# Patient Record
Sex: Male | Born: 1970 | Hispanic: No | Marital: Married
Health system: Southern US, Community
[De-identification: ages and names within clinical notes are randomized; demographics above are authoritative.]

---

## 2010-04-03 ENCOUNTER — Ambulatory Visit: Payer: Self-pay | Admitting: Hematology and Oncology

## 2010-04-04 ENCOUNTER — Other Ambulatory Visit: Payer: Self-pay | Admitting: Hematology and Oncology

## 2010-04-04 DIAGNOSIS — R161 Splenomegaly, not elsewhere classified: Secondary | ICD-10-CM

## 2010-04-04 LAB — CBC WITH DIFFERENTIAL/PLATELET
BASO%: 0.3 % (ref 0.0–2.0)
Basophils Absolute: 0 10*3/uL (ref 0.0–0.1)
EOS%: 1.8 % (ref 0.0–7.0)
Eosinophils Absolute: 0.1 10*3/uL (ref 0.0–0.5)
HCT: 41.9 % (ref 38.4–49.9)
HGB: 14.3 g/dL (ref 13.0–17.1)
LYMPH%: 30.3 % (ref 14.0–49.0)
MCH: 30.1 pg (ref 27.2–33.4)
MCHC: 34.2 g/dL (ref 32.0–36.0)
MCV: 88.2 fL (ref 79.3–98.0)
MONO#: 0.2 10*3/uL (ref 0.1–0.9)
MONO%: 7.4 % (ref 0.0–14.0)
NEUT#: 2 10*3/uL (ref 1.5–6.5)
NEUT%: 60.2 % (ref 39.0–75.0)
Platelets: 186 10*3/uL (ref 140–400)
RBC: 4.75 10*6/uL (ref 4.20–5.82)
RDW: 13.3 % (ref 11.0–14.6)
WBC: 3.3 10*3/uL — ABNORMAL LOW (ref 4.0–10.3)
lymph#: 1 10*3/uL (ref 0.9–3.3)

## 2010-04-04 LAB — MORPHOLOGY
PLT EST: ADEQUATE
RBC Comments: NORMAL

## 2010-04-05 LAB — COMPREHENSIVE METABOLIC PANEL
ALT: 15 U/L (ref 0–53)
AST: 15 U/L (ref 0–37)
Albumin: 4.5 g/dL (ref 3.5–5.2)
Alkaline Phosphatase: 62 U/L (ref 39–117)
BUN: 16 mg/dL (ref 6–23)
CO2: 26 mEq/L (ref 19–32)
Calcium: 9.7 mg/dL (ref 8.4–10.5)
Chloride: 103 mEq/L (ref 96–112)
Creatinine, Ser: 1.34 mg/dL (ref 0.40–1.50)
Glucose, Bld: 102 mg/dL — ABNORMAL HIGH (ref 70–99)
Potassium: 4.3 mEq/L (ref 3.5–5.3)
Sodium: 138 mEq/L (ref 135–145)
Total Bilirubin: 0.9 mg/dL (ref 0.3–1.2)
Total Protein: 7.4 g/dL (ref 6.0–8.3)

## 2010-04-05 LAB — LACTATE DEHYDROGENASE: LDH: 127 U/L (ref 94–250)

## 2010-04-05 LAB — VITAMIN B12: Vitamin B-12: 264 pg/mL (ref 211–911)

## 2010-04-05 LAB — FOLATE: Folate: 16.2 ng/mL

## 2010-04-05 LAB — TSH: TSH: 1.109 u[IU]/mL (ref 0.350–4.500)

## 2010-04-05 LAB — ANCA SCREEN W REFLEX TITER
Atypical p-ANCA Screen: NEGATIVE
c-ANCA Screen: NEGATIVE
p-ANCA Screen: NEGATIVE

## 2010-04-05 LAB — ANA: Anti Nuclear Antibody(ANA): NEGATIVE

## 2010-04-22 ENCOUNTER — Ambulatory Visit (HOSPITAL_COMMUNITY)
Admission: RE | Admit: 2010-04-22 | Discharge: 2010-04-22 | Disposition: A | Discharge status: Home / Self Care | Payer: BC Managed Care – PPO | Source: Ambulatory Visit | Attending: Hematology and Oncology | Admitting: Hematology and Oncology

## 2010-04-22 ENCOUNTER — Other Ambulatory Visit: Payer: Self-pay | Admitting: Hematology and Oncology

## 2010-04-22 DIAGNOSIS — R161 Splenomegaly, not elsewhere classified: Secondary | ICD-10-CM

## 2010-04-22 DIAGNOSIS — D72819 Decreased white blood cell count, unspecified: Secondary | ICD-10-CM | POA: Insufficient documentation

## 2010-04-23 ENCOUNTER — Encounter (HOSPITAL_BASED_OUTPATIENT_CLINIC_OR_DEPARTMENT_OTHER): Payer: BC Managed Care – PPO | Admitting: Hematology and Oncology

## 2010-04-23 DIAGNOSIS — D709 Neutropenia, unspecified: Secondary | ICD-10-CM

## 2011-01-10 ENCOUNTER — Encounter: Payer: Self-pay | Admitting: Nurse Practitioner

## 2011-01-16 ENCOUNTER — Telehealth: Payer: Self-pay | Admitting: Hematology and Oncology

## 2011-01-16 NOTE — Telephone Encounter (Signed)
Due to epic conversion nov appt moved to [place]. Also per 10/29 pof pt FTKA for 10/30 lb - r/s 1wk b4 jan f/u lmonvm for pt today re changes w/new appt d/t's for 1/4 + 1/11. Jan schedule mailed today.

## 2011-03-21 ENCOUNTER — Other Ambulatory Visit: Payer: Self-pay | Admitting: Hematology and Oncology

## 2011-03-21 ENCOUNTER — Other Ambulatory Visit (HOSPITAL_BASED_OUTPATIENT_CLINIC_OR_DEPARTMENT_OTHER): Payer: BC Managed Care – PPO

## 2011-03-21 DIAGNOSIS — D709 Neutropenia, unspecified: Secondary | ICD-10-CM

## 2011-03-21 LAB — BASIC METABOLIC PANEL
BUN: 13 mg/dL (ref 6–23)
CO2: 27 mEq/L (ref 19–32)
Chloride: 104 mEq/L (ref 96–112)
Creatinine, Ser: 1.4 mg/dL — ABNORMAL HIGH (ref 0.50–1.35)
Glucose, Bld: 79 mg/dL (ref 70–99)

## 2011-03-21 LAB — CBC WITH DIFFERENTIAL/PLATELET
BASO%: 0.3 % (ref 0.0–2.0)
Basophils Absolute: 0 10*3/uL (ref 0.0–0.1)
Eosinophils Absolute: 0.1 10*3/uL (ref 0.0–0.5)
HCT: 40.7 % (ref 38.4–49.9)
HGB: 13.8 g/dL (ref 13.0–17.1)
LYMPH%: 34.4 % (ref 14.0–49.0)
MONO#: 0.3 10*3/uL (ref 0.1–0.9)
NEUT#: 1.9 10*3/uL (ref 1.5–6.5)
NEUT%: 54.4 % (ref 39.0–75.0)
Platelets: 206 10*3/uL (ref 140–400)
WBC: 3.5 10*3/uL — ABNORMAL LOW (ref 4.0–10.3)
lymph#: 1.2 10*3/uL (ref 0.9–3.3)

## 2011-03-24 ENCOUNTER — Telehealth: Payer: Self-pay | Admitting: Hematology and Oncology

## 2011-03-24 NOTE — Telephone Encounter (Signed)
S/w pt today re appt for 1/8 @ 11:15 am. Per thu 1/11 appt r/s to 1/8 w/RJ.

## 2011-03-25 ENCOUNTER — Telehealth: Payer: Self-pay | Admitting: Hematology and Oncology

## 2011-03-25 ENCOUNTER — Ambulatory Visit (HOSPITAL_BASED_OUTPATIENT_CLINIC_OR_DEPARTMENT_OTHER): Payer: BC Managed Care – PPO | Admitting: Physician Assistant

## 2011-03-25 VITALS — BP 126/74 | HR 75 | Temp 97.8°F | Ht 70.0 in | Wt 191.9 lb

## 2011-03-25 DIAGNOSIS — D709 Neutropenia, unspecified: Secondary | ICD-10-CM

## 2011-03-25 NOTE — Telephone Encounter (Signed)
gve the pt his oct 2013 appt calendar °

## 2011-03-25 NOTE — Progress Notes (Signed)
This office note has been dictated.

## 2011-03-25 NOTE — Progress Notes (Signed)
CC:   Cain Saupe, MD  IDENTIFYING STATEMENT:  Mr. 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  INTERIM HISTORY:  Mr. Mckinney reports since his last clinic visit in February 2012 he has had overall normal energy level.  No fevers, chills or night sweats.  No problems with dyspnea or cough.  He does report a recent cold but states that he has completely gotten over this.  He has had no issues with anorexia, no nausea or vomiting, constipation or diarrhea.  No rectal bleeding.  No dysuria, no frequency or hematuria. No alteration in sensation or balance or swelling of extremities.  He does report some intermittent right hip discomfort which has been present since the fall of the year.  He was evaluated by his primary physician on this past Monday and states that at that time he did not have any type of x-rays and also it was felt that he would continue to monitor the discomfort and he would follow back up with his primary if the pain worsened.  He states it does not affect his ability to ambulate and this does not occur every daily.  Usually it is relieved with ibuprofen 1 dose.  Other medications are reviewed and recorded, and the patient states he does still take a multivitamin with B12 in it as well as lisinopril with hydrochlorothiazide.  PHYSICAL EXAMINATION:  Temperature is 97.8, heart rate 75, respirations 20, blood pressure 126/74, weight is 191 pounds 14 ounces.  General: This is a well-developed, well-nourished black male in no acute distress.  HEENT:  Sclerae are nonicteric.  There is no oral thrush or mucositis.  Skin:  No rashes or lesions.  Lymphatic:  No cervical, supraclavicular, axillary or inguinal lymphadenopathy.  Cardiac: Regular rate and rhythm without murmurs or gallops.  Peripheral pulses are 2-plus.  Chest:  Lungs clear to auscultation.  Abdomen:  Positive bowel sounds, soft, nontender, nondistended.  No  organomegaly. Extremities:  No edema, cyanosis or calf tenderness.  Neurologic:  Alert and oriented x3.  Strength, sensation and coordination all grossly intact.  LABORATORY DATA:  Laboratory data from April 10, 2011:  CBC with differential reveals white blood count of 3.5, hemoglobin 13.8, hematocrit of 40.7, platelets of 206, ANC of 1.9, and MCV of 85.7. Chemistries reveal a sodium of 141, potassium 4.5, chloride 104, BUN 13, creatinine 1.40, glucose of 79, and calcium of 9.6.  B12 level of 364.  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  He has had stable white blood cell count over the past 11 months and no sense of ill health.  He will be scheduled for followup with Dr. Dalene Carrow in 9 months' time.  A few days before this we will reassess CBC with differential, BMET and B12 level.  He is advised to call in the interim with any questions or problems.    ______________________________ Michail Sermon, NP RH/MEDQ  D:  03/25/2011  T:  03/25/2011  Job:  [id number]

## 2011-03-28 ENCOUNTER — Ambulatory Visit: Payer: BC Managed Care – PPO | Admitting: Physician Assistant

## 2011-12-23 ENCOUNTER — Telehealth: Payer: Self-pay | Admitting: Hematology and Oncology

## 2011-12-23 NOTE — Telephone Encounter (Signed)
°  I spoke with Mr. Uhrig today regarding a new lab time because the lab staff has an AM meeting on 12/25/11. Mr. Dudek states he will not be able to keep his 10/10 lab appt or the 10/15 f/u appt because he will be out of town for a few more weeks. He says he will call back to let us know when he can come in. Message to LO.   Melissa

## 2011-12-25 ENCOUNTER — Other Ambulatory Visit: Payer: BC Managed Care – PPO | Admitting: Lab

## 2011-12-29 ENCOUNTER — Telehealth: Payer: Self-pay | Admitting: Hematology and Oncology

## 2011-12-29 NOTE — Telephone Encounter (Signed)
Pt called and wants to r/s 10/15 lb to late December or early January. Pt also missed 10/10 lb. Message to LO. Pt aware.

## 2011-12-30 ENCOUNTER — Telehealth: Payer: Self-pay | Admitting: *Deleted

## 2011-12-30 ENCOUNTER — Ambulatory Visit: Payer: BC Managed Care – PPO | Admitting: Hematology and Oncology

## 2011-12-30 ENCOUNTER — Other Ambulatory Visit: Payer: Self-pay | Admitting: *Deleted

## 2011-12-30 NOTE — Telephone Encounter (Signed)
03-19-2012 at 9:30am  03-16-2012 at 9:00am  Mailed out calendar to inform the patient of the new date and time

## 2011-12-31 ENCOUNTER — Other Ambulatory Visit: Payer: Self-pay | Admitting: *Deleted

## 2012-02-02 IMAGING — US US ABDOMEN LIMITED
1 series · 8 of 8 positions shown · non-contrast
Comparison: None

CLINICAL DATA: Decreased white blood cell count.  Evaluate splenic
sinus

ABDOMEN ULTRASOUND LIMITED
TECHNIQUE: Routine study limited to the spleen.

[Series 1: us abdomen limited · 0.30mm/px · 8 of 8 slices shown]
[im 1/8]
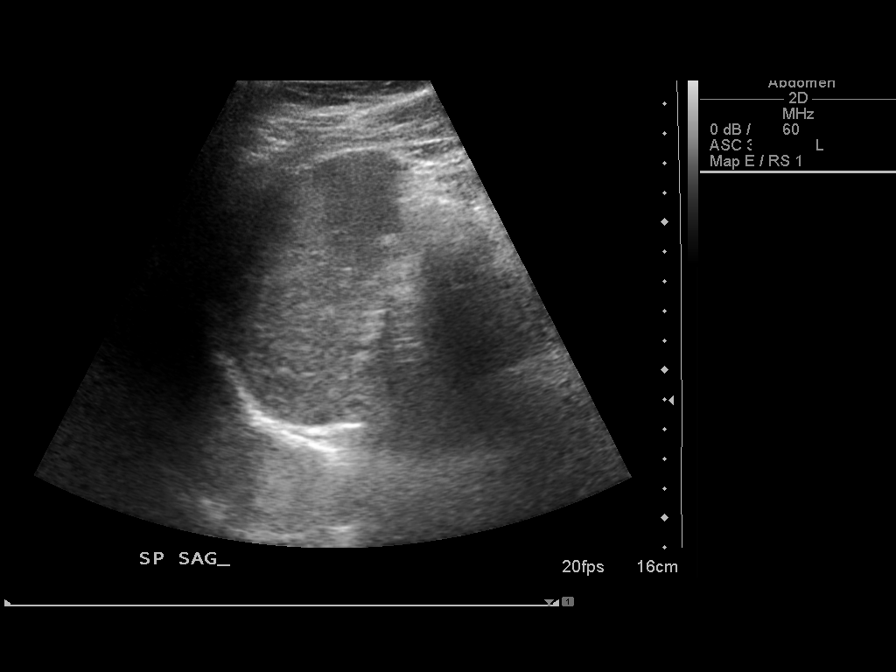
[im 2/8]
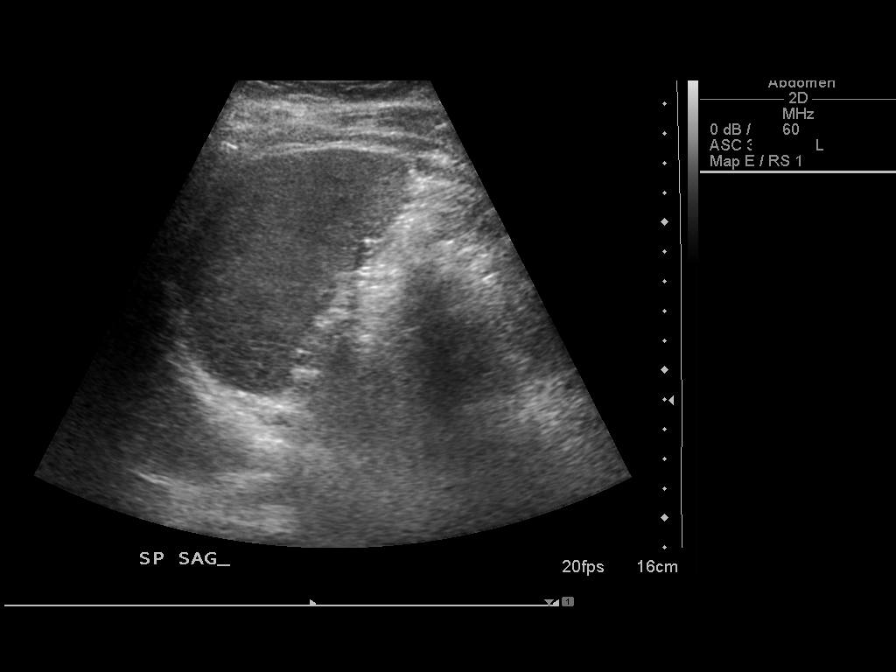
[im 3/8]
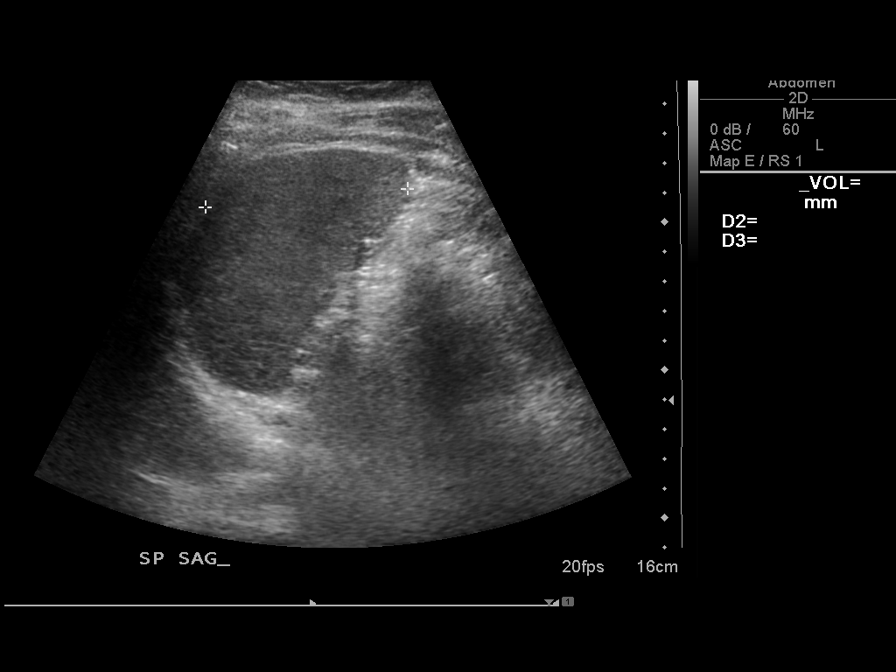
[im 4/8]
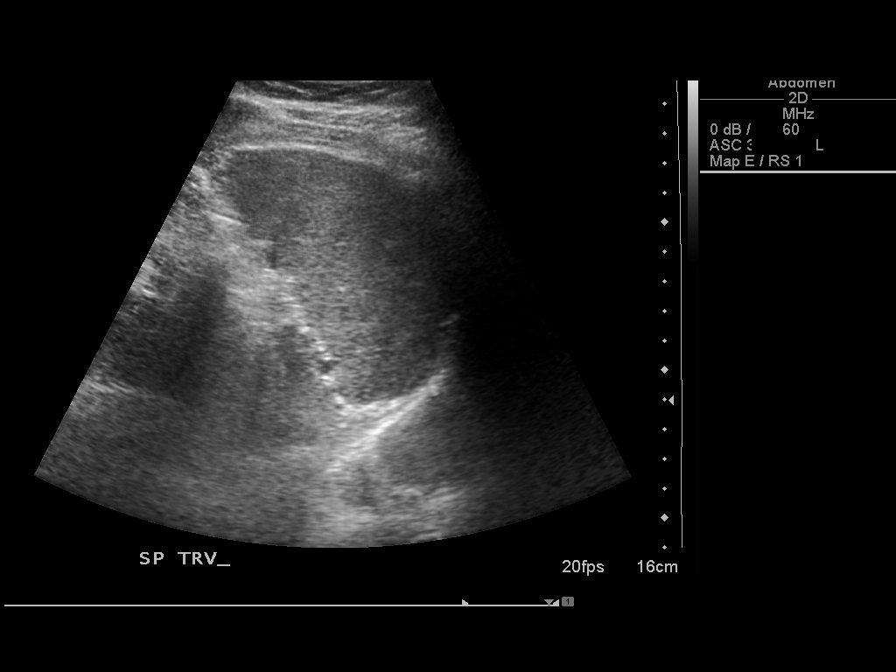
[im 5/8]
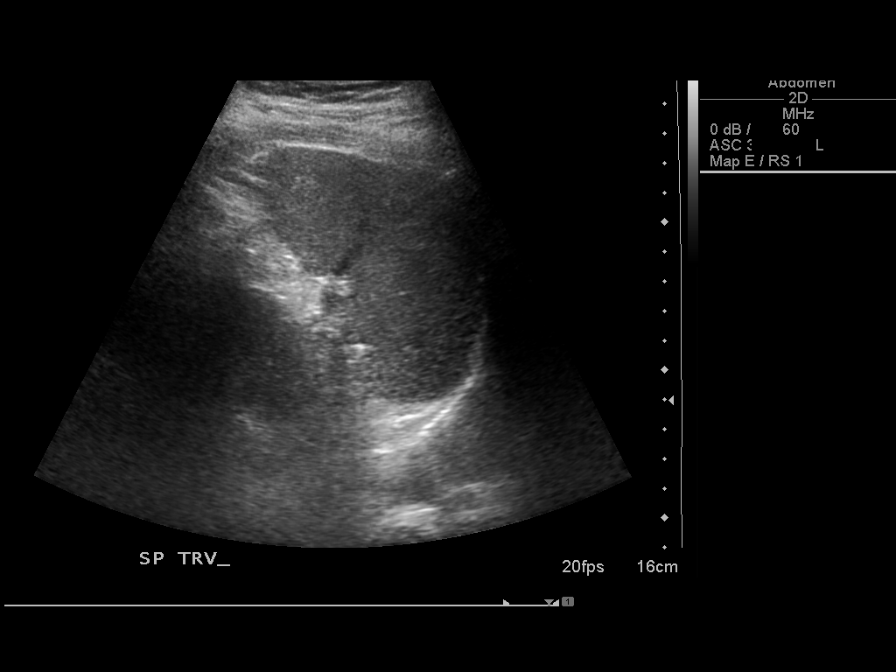
[im 6/8]
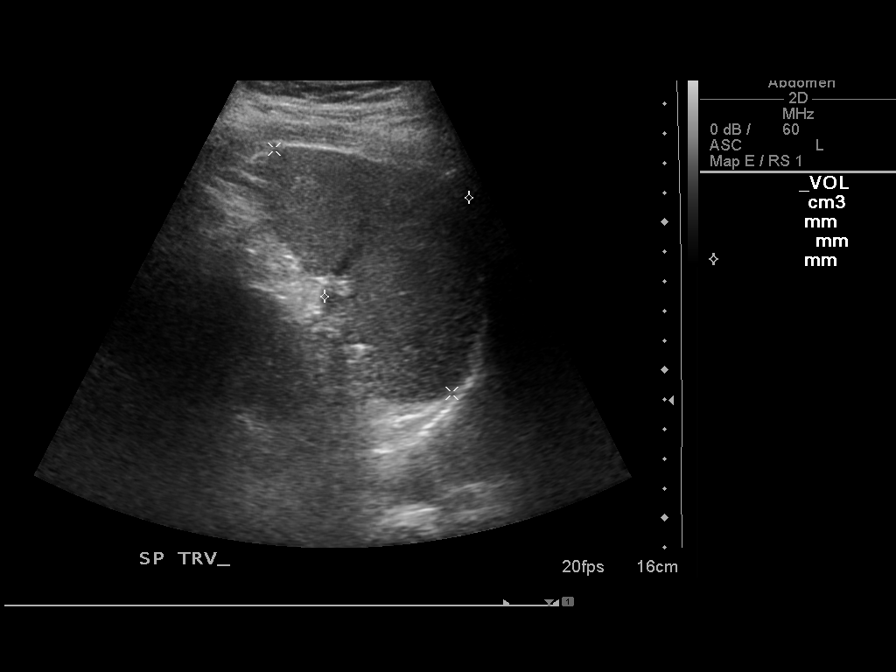
[im 7/8]
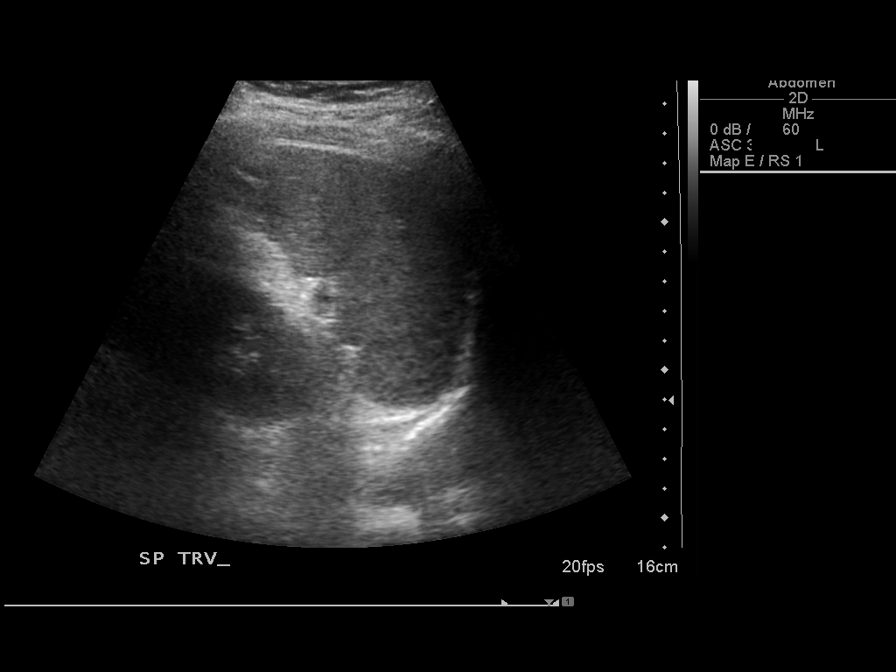
[im 8/8]
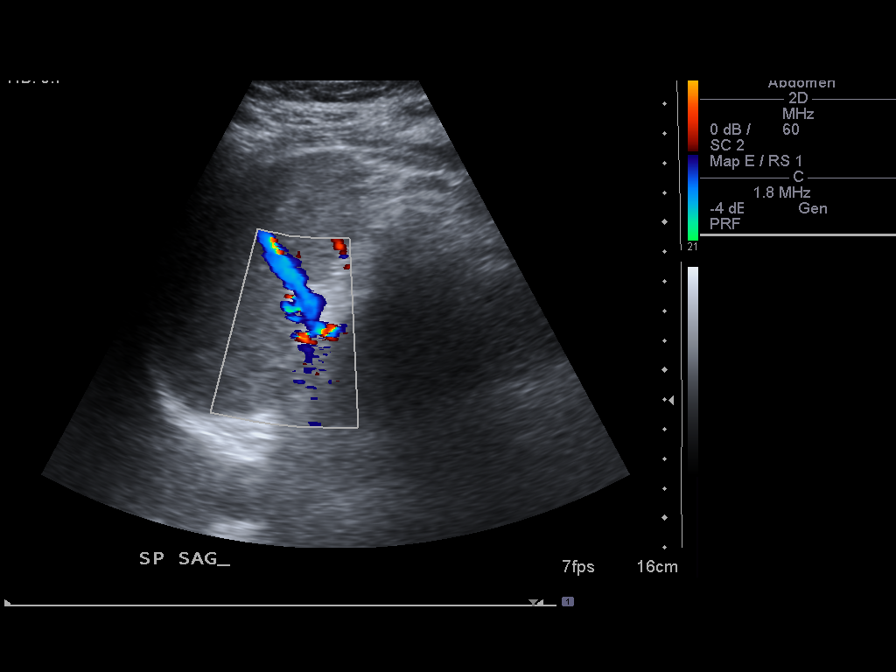

[8 of 8 positions shown; findings below may reference images not displayed]

FINDINGS: The spleen measures 6.9 x 10.2 x 5.9 cm.  Volume is
estimated at 217 cm cubed. There are no focal lesions.
IMPRESSION: The spleen is sonographically normal.

## 2012-03-06 ENCOUNTER — Telehealth: Payer: Self-pay | Admitting: Oncology

## 2012-03-06 NOTE — Telephone Encounter (Signed)
lmonvm adviisng the pt to gve me a call back regarding rescheduling his appt.

## 2012-03-16 ENCOUNTER — Other Ambulatory Visit: Payer: BC Managed Care – PPO | Admitting: Lab

## 2012-03-19 ENCOUNTER — Ambulatory Visit: Payer: BC Managed Care – PPO | Admitting: Hematology and Oncology

## 2012-03-20 ENCOUNTER — Telehealth: Payer: Self-pay | Admitting: Oncology

## 2012-03-20 ENCOUNTER — Encounter: Payer: Self-pay | Admitting: Oncology

## 2012-03-20 NOTE — Telephone Encounter (Signed)
S/w pt today re reassignment and new provider. Per pt appt moved from 1/10 to 1/31.

## 2012-03-23 ENCOUNTER — Ambulatory Visit: Payer: BC Managed Care – PPO | Admitting: Oncology

## 2012-03-26 ENCOUNTER — Ambulatory Visit: Payer: BC Managed Care – PPO | Admitting: Nurse Practitioner

## 2012-04-16 ENCOUNTER — Ambulatory Visit: Payer: BC Managed Care – PPO | Admitting: Nurse Practitioner

## 2013-05-14 ENCOUNTER — Encounter: Payer: Self-pay | Admitting: *Deleted

## 2013-05-14 ENCOUNTER — Telehealth: Payer: Self-pay | Admitting: *Deleted

## 2013-05-14 NOTE — Telephone Encounter (Signed)
Former pt of DR. G...td  

## 2013-07-20 ENCOUNTER — Ambulatory Visit: Payer: BC Managed Care – PPO | Admitting: Oncology

## 2014-03-20 ENCOUNTER — Emergency Department (HOSPITAL_COMMUNITY)
Admission: AD | Admit: 2014-03-20 | Discharge: 2014-04-17 | Disposition: E | Payer: BC Managed Care – PPO | Source: Other Acute Inpatient Hospital | Attending: Emergency Medicine | Admitting: Emergency Medicine

## 2014-03-20 ENCOUNTER — Encounter (HOSPITAL_COMMUNITY): Payer: Self-pay | Admitting: Emergency Medicine

## 2014-03-20 ENCOUNTER — Encounter (HOSPITAL_COMMUNITY): Admission: AD | Disposition: E | Payer: Self-pay | Attending: Emergency Medicine

## 2014-03-20 DIAGNOSIS — Z862 Personal history of diseases of the blood and blood-forming organs and certain disorders involving the immune mechanism: Secondary | ICD-10-CM | POA: Insufficient documentation

## 2014-03-20 DIAGNOSIS — I469 Cardiac arrest, cause unspecified: Secondary | ICD-10-CM | POA: Diagnosis present

## 2014-03-20 DIAGNOSIS — Z79899 Other long term (current) drug therapy: Secondary | ICD-10-CM | POA: Insufficient documentation

## 2014-03-20 DIAGNOSIS — Z978 Presence of other specified devices: Secondary | ICD-10-CM

## 2014-03-20 DIAGNOSIS — R4189 Other symptoms and signs involving cognitive functions and awareness: Secondary | ICD-10-CM

## 2014-03-20 DIAGNOSIS — R079 Chest pain, unspecified: Secondary | ICD-10-CM | POA: Insufficient documentation

## 2014-03-20 SURGERY — LEFT HEART CATHETERIZATION WITH CORONARY ANGIOGRAM
Anesthesia: LOCAL

## 2014-03-20 MED ORDER — DEXTROSE 5 % IV SOLN
300.0000 mg | INTRAVENOUS | Status: AC | PRN
  Administered 2014-03-20: 300 mg via INTRAVENOUS

## 2014-03-20 MED ORDER — ATROPINE SULFATE 1 MG/ML IJ SOLN
INTRAMUSCULAR | Status: AC | PRN
  Administered 2014-03-20: 1 mg via INTRAVENOUS

## 2014-03-20 MED ORDER — EPINEPHRINE HCL 0.1 MG/ML IJ SOSY
PREFILLED_SYRINGE | INTRAMUSCULAR | Status: AC | PRN
  Administered 2014-03-20: 1 mg via INTRAVENOUS
  Administered 2014-03-20: 1 via INTRAVENOUS
  Administered 2014-03-20 (×3): 1 mg via INTRAVENOUS

## 2014-04-17 DIAGNOSIS — 419620001 Death: Secondary | SNOMED CT

## 2014-04-17 NOTE — Progress Notes (Addendum)
Chaplain paged for the arrival of more of Mr. Brian Avila family. His Godmother is currently bedside.   Wife asked earlier that when his parents arrived, Brian Avila and Brian Avila, that his wife be notified so that she can be moved to a family room so that she doesn't have to see them. The situation would be very hostile if they should meet.   Pt has a son from a previous relationship, wife does not wish to be present if the mother of his child should arrive also.   Please page me and call the nurse's station so that his wife can be moved to another room. Family asked that if available I be present.   Gala Romney, Chaplain 2014-03-30

## 2014-04-17 NOTE — ED Notes (Signed)
[facility]. Call for syncope. EMS arrived Pt unresponsive. NSR initially, changed to torsades minutes after arrival. Defib at 200 by EMS. Intubated with 7.0 ETT by EMS in field. Asystole upon EMS arrival to [facility], compressions started.

## 2014-04-17 NOTE — ED Provider Notes (Addendum)
Seen on arrival, CPR in progress per patient's wife patient had been complaining of chest pain for several hours prior to calling 911. He collapsed in the car. EMS arrived to find him with agonal respirations unresponsive sinus rhythm with a weak carotid pulse. Patient's rhythm deteriorated to ventricular tachycardia without pulses. He was defibrillated in the field. Rhythm return to sinus bradycardia with weak carotid pulses per EMS. prehospital 12-lead EKG showed acute anterior wall myocardial infarction. Upon arrival here CPR in progress. He had reportedly lost his pulses again as the ambulance was pulling into the driveway of the hospital. Code STEMI called by me prior to patient's arrival as well as code"Ice". Rhythm ventricular fibrillation. He was defibrillated with 300 J. CPR continued. ET tube placement confirmed by me using direct laryngoscopy ET tube in trachea. Administered amiodarone 300 mg IV and epinephrine intravenously. Patient pronounced dead at 1816 p.m. Wife was here in the emergency department and notified. Medical examiner notified and will assume care of body. Diagnosis dead on arrival CRITICAL CARE Performed by: Doug Sou Total critical care time: 30 minute Critical care time was exclusive of separately billable procedures and treating other patients. Critical care was necessary to treat or prevent imminent or life-threatening deterioration. Critical care was time spent personally by me on the following activities: development of treatment plan with patient and/or surrogate as well as nursing, discussions with consultants, evaluation of patient's response to treatment, examination of patient, obtaining history from patient or surrogate, ordering and performing treatments and interventions, ordering and review of laboratory studies, ordering and review of radiographic studies, pulse oximetry and re-evaluation of patient's condition.  Doug Sou, MD 29-Mar-2014 1836  Doug Sou, MD 2014-03-29 Brian Avila

## 2014-04-17 NOTE — ED Notes (Signed)
Chaplain and last of family are leaving now. Patient being placed in post mortem bag, eyes dampened with saline gauze, and bed control aware of patient's impending arrival to the morgue.

## 2014-04-17 NOTE — Progress Notes (Signed)
Chaplain with pt's wife since arrival providing emotional support. Pt passed and wife is currently grieving.  Wife expressed she is not from here and has no family in the area. Pt is a member in the [employer]. Wife contacted his captain before he passed.   There is tension between the pt's wife and mother. Wife is asking that she not see his mother. Front desk has been asked to call the nurse's station before Brian Avila mother comes to the room so that pt's wife can be moved elsewhere.   Family friends are present with pt's wife at bedside.   Page as needed.

## 2014-04-17 NOTE — Progress Notes (Signed)
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 Some bedside with parents, others in consult A with his wife and other friends.

## 2014-04-17 NOTE — Progress Notes (Signed)
Family requested that if possible and lawful that Medical Examiner inform them of what's going on because " his wife is not speaking to [place]; we respect her rights as his wife and would also like to be informed about our son".   I told the family I would make a note in pt's chart concerning the ordeal.   Pt's Parents are Mr. And Mrs. John & Morrison Mcbryar.   [phone] Cell  [phone] Home

## 2014-04-17 NOTE — ED Provider Notes (Signed)
CSN: [id number]     Arrival date & time March 26, 2014  1751 History   First MD Initiated Contact with Patient 03/26/2014 1828     Chief Complaint  Patient presents with  . Cardiac Arrest     (Consider location/radiation/quality/duration/timing/severity/associated sxs/prior Treatment) Patient is a 44 y.o. male presenting with general illness.  Illness Location:  Cardiac arrest Quality:  Arrest Severity:  Severe Onset quality:  Sudden Duration: patient had complained of chest pain for several hours per wife, on EMS arrival he had agonal respirations, weak carotid pulse. Timing:  Constant Progression:  Waxing and waning Chronicity:  New Context:  Pt with chest paiin for hours, syncopized and EMS called and found him agonal with weak pulse, he then had vtach and was defibrillated, with return to sinus bradycardia and 12 lead showed acute anterior MI, He was intubated and transported however lost pulse again on arrival to the ED Relieved by:  Epi/defibrillation Ineffective treatments:  Epi/defibrillation/pacing Associated symptoms: chest pain     Past Medical History  Diagnosis Date  . Neutropenia    History reviewed. No pertinent past surgical history. History reviewed. No pertinent family history. History  Substance Use Topics  . Smoking status: Not on file  . Smokeless tobacco: Not on file  . Alcohol Use: Not on file    Review of Systems  Unable to perform ROS: Intubated  Cardiovascular: Positive for chest pain.      Allergies  Review of patient's allergies indicates no known allergies.  Home Medications   Prior to Admission medications   Medication Sig Start Date End Date Taking? Authorizing Provider  lisinopril-hydrochlorothiazide (PRINZIDE,ZESTORETIC) 20-25 MG per tablet Take 1 tablet by mouth daily.      Historical Provider, MD  multivitamin-iron-minerals-folic acid (CENTRUM) chewable tablet Chew 1 tablet by mouth daily.      Historical Provider, MD   Pulse   Wt 190  lb (86.183 kg) Physical Exam  Constitutional: He appears well-developed and well-nourished. He has a sickly appearance. He appears ill.  Eyes: Pupils are equal, round, and reactive to light.  Cardiovascular:  Femoral pulses with compressions, no pulses on pulse check   Pulmonary/Chest: He has no wheezes. He has no rales.  Intubated, bilateral breath sounds   Abdominal: He exhibits no distension. There is no tenderness.  Musculoskeletal: He exhibits no edema.  Neurological: He is unresponsive. GCS eye subscore is 1. GCS verbal subscore is 1. GCS motor subscore is 1.  GCS 3  Skin: No rash noted.    ED Course  Procedures (including critical care time) Labs Review Labs Reviewed - No data to display  Imaging Review No results found.   EKG Interpretation None      MDM   Final diagnoses:  DOA (dead on arrival)  Cardiac arrest  Endotracheally intubated  Unresponsive   44 year old male with a history of neutropenia for which he is seen by hematology presents with concern of cardiac arrest. History from wife is that he had had several hours of chest pain prior to syncopized in area and on EMS arrival he had agonal respirations and a weak carotid pulse prior to having VTach cardiac arrest.  He was defibrillated with a return of pulse and sinus bradycardia for which he was paced. He was intubated and transported to the emergency department, however as they were arriving to the ambulance bay he lost pulses and had asystole on arrival to the ED.  Code STEMI and Hypothermic protocol had been called prior to arrival.  ETT tube was confirmed by bilateral breath sounds and visualization of tube by DL. Chest compressions and resuscitation was continued with patient receiving multiple rounds of epinephrine as well as amiodarone.  He was defibrillated for ventricular fibrillation, however on subsequent pulse and rhythm checks he had asystole.  After greater than 30 minutes of resuscitation without  return of spontaneous circulation, time of death was called at 1816.  His wife was at bedside and medical examiner will take patient as a case.    Rhae Lerner, MD 03/21/14 [id number]  Doug Sou, MD 03/22/14 1212

## 2014-04-17 NOTE — Code Documentation (Signed)
Time of Death called by Jacubowitz MD. NO cardiac activity on ultrasound

## 2014-04-17 NOTE — ED Notes (Signed)
pts wife given all pt belongings, including clothing and wallet. No wedding ring noted on admission.

## 2014-04-17 NOTE — Progress Notes (Signed)
Pt bagged by RT until code was called by MD.

## 2014-04-17 NOTE — Code Documentation (Signed)
Family at beside. Family given emotional support. Spouse. Pt wallet given to spouse

## 2014-04-17 DEATH — deceased
